# Patient Record
Sex: Male | Born: 2000 | Race: White | Hispanic: No | Marital: Single | Smoking: Never smoker
Health system: Southern US, Community
[De-identification: ages and names within clinical notes are randomized; demographics above are authoritative.]

## PROBLEM LIST (undated history)

## (undated) ENCOUNTER — Emergency Department (HOSPITAL_COMMUNITY): Payer: 59 | Source: Home / Self Care

## (undated) HISTORY — PX: FINGER SURGERY: SHX640

## (undated) HISTORY — PX: BACK SURGERY: SHX140

---
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
## 2019-04-07 DIAGNOSIS — Z681 Body mass index (BMI) 19 or less, adult: Secondary | ICD-10-CM | POA: Diagnosis not present

## 2019-04-07 DIAGNOSIS — Z0000 Encounter for general adult medical examination without abnormal findings: Secondary | ICD-10-CM | POA: Diagnosis not present

## 2019-09-30 ENCOUNTER — Other Ambulatory Visit: Payer: Self-pay

## 2019-09-30 ENCOUNTER — Emergency Department (INDEPENDENT_AMBULATORY_CARE_PROVIDER_SITE_OTHER)
Admission: EM | Admit: 2019-09-30 | Discharge: 2019-09-30 | Disposition: A | Discharge status: Home / Self Care | Payer: 59 | Source: Home / Self Care | Attending: Family Medicine | Admitting: Family Medicine

## 2019-09-30 DIAGNOSIS — H6122 Impacted cerumen, left ear: Secondary | ICD-10-CM

## 2019-09-30 NOTE — ED Provider Notes (Signed)
[facility]    CSN: [id number] Arrival date & time: 09/30/19  1451      History   Chief Complaint Chief Complaint  Patient presents with  . Ear clogged    LT    HPI Dalton Hale is a 18 y.o. male.   Patient reports that his left ear has felt clogged for three days without pain.  He denies nasal congestion or recent URI.  No fevers, chills, and sweats.  He has had a negative COVID19 test.  The history is provided by the patient.    History reviewed. No pertinent past medical history.  Patient Active Problem List   Diagnosis Date Noted  . Contusion of right hip 01/08/2014    History reviewed. No pertinent surgical history.     Home Medications    Prior to Admission medications   Medication Sig Start Date End Date Taking? Authorizing Provider  ibuprofen (ADVIL,MOTRIN) 200 MG tablet Take three tablets ( 600 milligrams total) every 6 with food as needed for pain. 10/08/16   Jacqulyn Cane, MD  terbinafine (LAMISIL) 250 MG tablet Take 1 tablet (250 mg total) by mouth daily. For 14 days 11/30/16   Tyrell Antonio    Family History History reviewed. No pertinent family history.  Social History Social History   Tobacco Use  . Smoking status: Never Smoker  . Smokeless tobacco: Never Used  Substance Use Topics  . Alcohol use: No  . Drug use: No     Allergies   Patient has no known allergies.   Review of Systems Review of Systems No sore throat No cough No pleuritic pain No wheezing No nasal congestion No post-nasal drainage No sinus pain/pressure No itchy/red eyes No earache, but left ear feels clogged No hemoptysis No SOB No fever/chills No nausea No vomiting No abdominal pain No diarrhea No urinary symptoms No skin rash No fatigue No myalgias No headache   Physical Exam Triage Vital Signs ED Triage Vitals  Enc Vitals Group     BP 09/30/19 1535 116/72     Pulse Rate 09/30/19 1535 (!) 107     Resp 09/30/19 1535 18   Temp 09/30/19 1535 98.3 F (36.8 C)     Temp Source 09/30/19 1535 Oral     SpO2 09/30/19 1535 98 %     Weight --      Height --      Head Circumference --      Peak Flow --      Pain Score 09/30/19 1536 0     Pain Loc --      Pain Edu? --      Excl. in [place]? --    No data found.  Updated Vital Signs BP 116/72 (BP Location: Left Arm)   Pulse (!) 107   Temp 98.3 F (36.8 C) (Oral)   Resp 18   SpO2 98%   Visual Acuity Right Eye Distance:   Left Eye Distance:   Bilateral Distance:    Right Eye Near:   Left Eye Near:    Bilateral Near:     Physical Exam Vitals signs and nursing note reviewed.  Constitutional:      General: He is not in acute distress. HENT:     Head: Normocephalic.     Right Ear: Tympanic membrane and ear canal normal.     Left Ear: There is impacted cerumen.     Ears:     Comments: Post left ear lavage, left  canal and tympanic membrane appear normal.    Nose: Nose normal.     Mouth/Throat:     Mouth: Mucous membranes are moist.  Eyes:     Pupils: Pupils are equal, round, and reactive to light.  Cardiovascular:     Rate and Rhythm: Tachycardia present.  Pulmonary:     Effort: Pulmonary effort is normal.  Lymphadenopathy:     Cervical: No cervical adenopathy.  Skin:    General: Skin is warm and dry.  Neurological:     Mental Status: He is alert.      UC Treatments / Results  Labs (all labs ordered are listed, but only abnormal results are displayed) Labs Reviewed - No data to display  EKG   Radiology No results found.  Procedures Procedures (including critical care time)  Medications Ordered in UC Medications - No data to display  Initial Impression / Assessment and Plan / UC Course  I have reviewed the triage vital signs and the nursing notes.  Pertinent labs & imaging results that were available during my care of the patient were reviewed by me and considered in my medical decision making (see chart for details).    Call  for worsening symptoms   Final Clinical Impressions(s) / UC Diagnoses   Final diagnoses:  Impacted cerumen of left ear   Discharge Instructions   None    ED Prescriptions    None        Kandra Nicolas, MD 10/02/19 2223

## 2019-09-30 NOTE — ED Triage Notes (Signed)
Pt c/o LT ear feeling clogged. Has had a cold, but is COVID neg. Denies fever and cough. Taking sudafed prn.

## 2019-11-01 DIAGNOSIS — Z20828 Contact with and (suspected) exposure to other viral communicable diseases: Secondary | ICD-10-CM | POA: Diagnosis not present

## 2019-11-17 MED FILL — CEPHALEXIN 500 MG CAPSULE: 500 | 5 days supply | Qty: 19 | Fill #0

## 2019-11-17 MED FILL — HYDROCODON-APAP 7.5-325: 7.5-325 | 2 days supply | Qty: 10 | Fill #0

## 2019-11-17 MED FILL — IBUPROFEN 600 MG TABLET: 600 | 5 days supply | Qty: 20 | Fill #0

## 2019-12-06 MED FILL — CEPHALEXIN 500 MG CAPSULE: 500 | 5 days supply | Qty: 19 | Fill #0

## 2019-12-06 MED FILL — HYDROCODON-APAP 7.5-325: 7.5-325 | 2 days supply | Qty: 10 | Fill #0

## 2019-12-06 MED FILL — IBUPROFEN 600 MG TABLET: 600 | 5 days supply | Qty: 20 | Fill #0

## 2019-12-08 DIAGNOSIS — K011 Impacted teeth: Secondary | ICD-10-CM | POA: Diagnosis not present

## 2020-02-02 ENCOUNTER — Ambulatory Visit: Payer: 59

## 2020-02-03 ENCOUNTER — Ambulatory Visit: Payer: 59 | Attending: Internal Medicine

## 2020-02-03 DIAGNOSIS — Z23 Encounter for immunization: Secondary | ICD-10-CM

## 2020-02-03 NOTE — Progress Notes (Signed)
   Covid-19 Vaccination Clinic  Name:  Dalton Hale    MRN: [id number] DOB: 05/11/2001  02/03/2020  [name] was observed post Covid-19 immunization for 15 minutes without incident. He was provided with Vaccine Information Sheet and instruction to access the V-Safe system.   [name] was instructed to call 911 with any severe reactions post vaccine: Marland Kitchen Difficulty breathing  . Swelling of face and throat  . A fast heartbeat  . A bad rash all over body  . Dizziness and weakness   Immunizations Administered    Name Date Dose VIS Date Route   Pfizer COVID-19 Vaccine 02/03/2020  8:16 AM 0.3 mL 10/06/2019 Intramuscular   Manufacturer: [place]   Lot: [id number]   [place]: [id number]

## 2020-02-26 ENCOUNTER — Ambulatory Visit: Payer: 59 | Attending: Internal Medicine

## 2020-02-26 DIAGNOSIS — Z23 Encounter for immunization: Secondary | ICD-10-CM

## 2020-02-26 NOTE — Progress Notes (Signed)
   Covid-19 Vaccination Clinic  Name:  Dalton Hale    MRN: [id number] DOB: 09-20-2001  02/26/2020  Mr. Dalton Hale was observed post Covid-19 immunization for 15 minutes without incident. He was provided with Vaccine Information Sheet and instruction to access the V-Safe system.   Dalton Hale was instructed to call 911 with any severe reactions post vaccine: Marland Kitchen Difficulty breathing  . Swelling of face and throat  . A fast heartbeat  . A bad rash all over body  . Dizziness and weakness   Immunizations Administered    Name Date Dose VIS Date Route   Pfizer COVID-19 Vaccine 02/26/2020 11:46 AM 0.3 mL 12/20/2018 Intramuscular   Manufacturer: [place]   Lot: [id number]   [place]: [id number]

## 2020-10-07 DIAGNOSIS — L814 Other melanin hyperpigmentation: Secondary | ICD-10-CM | POA: Diagnosis not present

## 2020-10-07 DIAGNOSIS — D2271 Melanocytic nevi of right lower limb, including hip: Secondary | ICD-10-CM | POA: Diagnosis not present

## 2020-10-07 DIAGNOSIS — D225 Melanocytic nevi of trunk: Secondary | ICD-10-CM | POA: Diagnosis not present

## 2020-10-07 DIAGNOSIS — L74512 Primary focal hyperhidrosis, palms: Secondary | ICD-10-CM | POA: Diagnosis not present

## 2020-10-23 ENCOUNTER — Other Ambulatory Visit: Payer: Self-pay

## 2020-10-23 ENCOUNTER — Other Ambulatory Visit: Payer: 59

## 2020-10-23 DIAGNOSIS — Z20822 Contact with and (suspected) exposure to covid-19: Secondary | ICD-10-CM | POA: Diagnosis not present

## 2020-10-25 LAB — NOVEL CORONAVIRUS, NAA: SARS-CoV-2, NAA: NOT DETECTED

## 2020-10-25 LAB — SARS-COV-2, NAA 2 DAY TAT

## 2022-06-03 ENCOUNTER — Ambulatory Visit (INDEPENDENT_AMBULATORY_CARE_PROVIDER_SITE_OTHER): Payer: 59 | Admitting: Family Medicine

## 2022-06-03 ENCOUNTER — Encounter: Payer: Self-pay | Admitting: Family Medicine

## 2022-06-03 VITALS — BP 122/77 | HR 73 | Temp 98.4°F | Ht 69.75 in | Wt 121.8 lb

## 2022-06-03 DIAGNOSIS — Z0000 Encounter for general adult medical examination without abnormal findings: Secondary | ICD-10-CM | POA: Diagnosis not present

## 2022-06-03 NOTE — Progress Notes (Signed)
Office Note 06/03/2022  CC:  Chief Complaint  Patient presents with   Establish Care   HPI:  Dalton Hale is a 21 y.o. male who is here to establish care and annual health maintenance exam.  Old records were reviewed prior to or during today's visit.  Most recent CPE was 04/07/2019 by [facility] pediatrics in [place], Dr. Trevor Iha.  Cayleb feels well.  No acute concerns.  History reviewed. No pertinent past medical history.  History reviewed. No pertinent surgical history.  History reviewed. No pertinent family history.  Social History   Socioeconomic History   Marital status: Single    Spouse name: Not on file   Number of children: Not on file   Years of education: Not on file   Highest education level: Not on file  Occupational History   Not on file  Tobacco Use   Smoking status: Never   Smokeless tobacco: Never  Vaping Use   Vaping Use: Never used  Substance and Sexual Activity   Alcohol use: No   Drug use: No   Sexual activity: Not on file  Other Topics Concern   Not on file  Social History Narrative   High school grad, some college.   Works as a Animator, Optometrist.   No tobacco.  Occasional alcohol.   Social Determinants of Health   Financial Resource Strain: Not on file  Food Insecurity: Not on file  Transportation Needs: Not on file  Physical Activity: Not on file  Stress: Not on file  Social Connections: Not on file  Intimate Partner Violence: Not on file    Outpatient Encounter Medications as of 06/03/2022  Medication Sig   IBUPROFEN PO Take by mouth as needed.   [DISCONTINUED] ibuprofen (ADVIL,MOTRIN) 200 MG tablet Take three tablets ( 600 milligrams total) every 6 with food as needed for pain.   [DISCONTINUED] terbinafine (LAMISIL) 250 MG tablet Take 1 tablet (250 mg total) by mouth daily. For 14 days   No facility-administered encounter medications on file as of 06/03/2022.    No Known  Allergies  Review of Systems  Constitutional:  Negative for appetite change, chills, fatigue and fever.  HENT:  Negative for congestion, dental problem, ear pain and sore throat.   Eyes:  Negative for discharge, redness and visual disturbance.  Respiratory:  Negative for cough, chest tightness, shortness of breath and wheezing.   Cardiovascular:  Negative for chest pain, palpitations and leg swelling.  Gastrointestinal:  Negative for abdominal pain, blood in stool, diarrhea, nausea and vomiting.  Genitourinary:  Negative for difficulty urinating, dysuria, flank pain, frequency, hematuria and urgency.  Musculoskeletal:  Negative for arthralgias, back pain, joint swelling, myalgias and neck stiffness.  Skin:  Negative for pallor and rash.  Neurological:  Negative for dizziness, speech difficulty, weakness and headaches.  Hematological:  Negative for adenopathy. Does not bruise/bleed easily.  Psychiatric/Behavioral:  Negative for confusion and sleep disturbance. The patient is not nervous/anxious.     PE; Blood pressure 122/77, pulse 73, temperature 98.4 F (36.9 C), temperature source Oral, height 5' 9.75" (1.772 m), weight 121 lb 12.8 oz (55.2 kg), SpO2 98 %. Body mass index is 17.6 kg/m.  Physical Exam  Gen: Alert, well appearing.  Patient is oriented to person, place, time, and situation. AFFECT: pleasant, lucid thought and speech. ENT: Ears: EACs clear, normal epithelium.  TMs with good light reflex and landmarks bilaterally.  Eyes: no injection, icteris, swelling, or exudate.  EOMI, PERRLA. Nose: no  drainage or turbinate edema/swelling.  No injection or focal lesion.  Mouth: lips without lesion/swelling.  Oral mucosa pink and moist.  Dentition intact and without obvious caries or gingival swelling.  Oropharynx without erythema, exudate, or swelling.  Neck: supple/nontender.  No LAD, mass, or TM.  Carotid pulses 2+ bilaterally, without bruits. CV: RRR, no m/r/g.   LUNGS: CTA bilat,  nonlabored resps, good aeration in all lung fields. ABD: soft, NT, ND, BS normal.  No hepatospenomegaly or mass.  No bruits. EXT: no clubbing, cyanosis, or edema.  Musculoskeletal: no joint swelling, erythema, warmth, or tenderness.  ROM of all joints intact. Skin - no sores or suspicious lesions or rashes or color changes  Pertinent labs:  none  ASSESSMENT AND PLAN:   New patient, establishing care.  Health maintenance exam: Reviewed age and gender appropriate health maintenance issues (prudent diet, regular exercise, health risks of tobacco and excessive alcohol, use of seatbelts, fire alarms in home, use of sunscreen).  Also reviewed age and gender appropriate health screening as well as vaccine recommendations. Vaccines: Tdap UTD 2018 Labs:none indicated  An After Visit Summary was printed and given to the patient.  Signed:  Crissie Sickles, MD           06/03/2022

## 2022-08-03 ENCOUNTER — Encounter: Payer: Self-pay | Admitting: Emergency Medicine

## 2022-08-03 ENCOUNTER — Ambulatory Visit
Admission: EM | Admit: 2022-08-03 | Discharge: 2022-08-03 | Disposition: A | Discharge status: Home / Self Care | Payer: 59 | Attending: Urgent Care | Admitting: Urgent Care

## 2022-08-03 DIAGNOSIS — H6692 Otitis media, unspecified, left ear: Secondary | ICD-10-CM | POA: Diagnosis not present

## 2022-08-03 DIAGNOSIS — H6122 Impacted cerumen, left ear: Secondary | ICD-10-CM

## 2022-08-03 DIAGNOSIS — H7292 Unspecified perforation of tympanic membrane, left ear: Secondary | ICD-10-CM | POA: Diagnosis not present

## 2022-08-03 MED ORDER — AMOXICILLIN 500 MG PO CAPS: 1000.0000 mg | ORAL_CAPSULE | Freq: Two times a day (BID) | ORAL | 0 refills | Status: AC

## 2022-08-03 NOTE — ED Provider Notes (Signed)
[facility]    CSN: [id number] Arrival date & time: 08/03/22  1431      History   Chief Complaint Chief Complaint  Patient presents with   Ear Fullness    HPI Dalton Hale is a 21 y.o. male.   Pleasant 21 year old male presents today due to left ear pain starting several days ago.  Went to [employer] this past weekend, was swimming in the ocean.  States that the pain started shortly afterwards.  States is uncomfortable to movement of the tragus, but otherwise does not hurt to lay on his ear.  No drainage.  States that his hearing is slightly diminished, and leaning forward causes a pulsatile pressure behind his eardrum.  He denies any additional URI symptoms.  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 Has tried no OTC meds.    Ear Fullness    History reviewed. No pertinent past medical history.  Patient Active Problem List   Diagnosis Date Noted   Contusion of right hip 01/08/2014    History reviewed. No pertinent surgical history.     Home Medications    Prior to Admission medications   Medication Sig Start Date End Date Taking? Authorizing Provider  amoxicillin (AMOXIL) 500 MG capsule Take 2 capsules (1,000 mg total) by mouth 2 (two) times daily for 10 days. 08/03/22 08/13/22 Yes Kimi Bordeau L, PA  IBUPROFEN PO Take by mouth as needed.    [provider]    Family History History reviewed. No pertinent family history.  Social History Social History   Tobacco Use   Smoking status: Never   Smokeless tobacco: Never  Vaping Use   Vaping Use: Never used  Substance Use Topics   Alcohol use: No   Drug use: No     Allergies   Patient has no known allergies.   Review of Systems Review of Systems  Eyes:  Positive for pain.  All other systems reviewed and are negative.    Physical Exam Triage Vital Signs ED Triage Vitals  Enc Vitals Group     BP 08/03/22 1503 135/74     Pulse Rate  08/03/22 1503 96     Resp 08/03/22 1503 14     Temp 08/03/22 1503 98.7 F (37.1 C)     Temp Source 08/03/22 1503 Oral     SpO2 08/03/22 1503 98 %     Weight --      Height --      Head Circumference --      Peak Flow --      Pain Score 08/03/22 1504 2     Pain Loc --      Pain Edu? --      Excl. in [place]? --    No data found.  Updated Vital Signs BP 135/74 (BP Location: Right Arm)   Pulse 96   Temp 98.7 F (37.1 C) (Oral)   Resp 14   SpO2 98%   Visual Acuity Right Eye Distance:   Left Eye Distance:   Bilateral Distance:    Right Eye Near:   Left Eye Near:    Bilateral Near:     Physical Exam Vitals and nursing note reviewed.  Constitutional:      General: He is not in acute distress.    Appearance: Normal appearance. He is well-developed. He is not ill-appearing, toxic-appearing or diaphoretic.  HENT:     Head: Normocephalic and atraumatic.  Jaw: There is normal jaw occlusion. No trismus.     Salivary Glands: Right salivary gland is not diffusely enlarged or tender. Left salivary gland is not diffusely enlarged or tender.     Right Ear: Tympanic membrane, ear canal and external ear normal. No middle ear effusion. There is no impacted cerumen. Tympanic membrane is not injected, scarred, perforated, erythematous, retracted or bulging. Tympanic membrane has normal mobility.     Left Ear: A middle ear effusion is present. There is impacted cerumen. Tympanic membrane is injected, perforated, erythematous and retracted. Tympanic membrane has decreased mobility.     Nose: Nose normal. No congestion or rhinorrhea.     Mouth/Throat:     Mouth: Mucous membranes are moist.     Pharynx: Oropharynx is clear. No oropharyngeal exudate or posterior oropharyngeal erythema.  Eyes:     General: No scleral icterus.       Right eye: No discharge.        Left eye: No discharge.     Extraocular Movements: Extraocular movements intact.     Conjunctiva/sclera: Conjunctivae normal.      Pupils: Pupils are equal, round, and reactive to light.  Cardiovascular:     Rate and Rhythm: Normal rate.  Pulmonary:     Effort: Pulmonary effort is normal. No respiratory distress.  Musculoskeletal:     Cervical back: Normal range of motion and neck supple. No rigidity or tenderness.  Lymphadenopathy:     Cervical: No cervical adenopathy.  Skin:    General: Skin is warm and dry.     Coloration: Skin is not jaundiced.     Findings: No erythema or rash.  Neurological:     General: No focal deficit present.     Mental Status: He is alert and oriented to person, place, and time.  Psychiatric:        Mood and Affect: Mood normal.      UC Treatments / Results  Labs (all labs ordered are listed, but only abnormal results are displayed) Labs Reviewed - No data to display  EKG   Radiology No results found.  Procedures Ear Cerumen Removal  Date/Time: 08/03/2022 3:56 PM  Performed by: Chaney Malling, PA Authorized by: Chaney Malling, PA   Consent:    Consent obtained:  Verbal   Consent given by:  Patient   Risks, benefits, and alternatives were discussed: yes     Risks discussed:  Bleeding, dizziness, infection, incomplete removal, TM perforation and pain   Alternatives discussed:  No treatment Universal protocol:    Procedure explained and questions answered to patient or proxy's satisfaction: yes     Patient identity confirmed:  Verbally with patient Procedure details:    Location:  L ear   Procedure type: curette     Procedure outcomes: cerumen removed   Post-procedure details:    Inspection:  No bleeding and ear canal clear   Hearing quality:  Improved   Procedure completion:  Tolerated Comments:     TM perf noted prior to removal of cerumen which was not completely obstructing view of TM  (including critical care time)  Medications Ordered in UC Medications - No data to display  Initial Impression / Assessment and Plan / UC Course  I have reviewed the  triage vital signs and the nursing notes.  Pertinent labs & imaging results that were available during my care of the patient were reviewed by me and considered in my medical decision making (see chart for details).  Acute OM L ear with perf -we will start antibiotics twice daily for the next 10 days.  Recommended patient follow-up with his PCP in the next 2 weeks for further evaluation of his eardrum.  Small perforation noted centrally. Impacted cerumen of left ear -removed in clinic today.  Small amount of stool softener had to be placed in the ear to soften wax, removed with curette without incident.   Final Clinical Impressions(s) / UC Diagnoses   Final diagnoses:  Acute otitis media of left ear with perforation  Impacted cerumen of left ear     Discharge Instructions      You have a left ear infection.  It appears as though the infection has also caused a microperforation, which is a small hole in your eardrum. Please take the antibiotics prescribed today.  Do not stop them just because you are feeling better, take all of them until gone. Please schedule follow-up with your primary care physician to have them take a look at your ear in the next 3 to 4 weeks. We need to ensure that the perforation heals itself. Do not stick anything in your ear or submerge your head in water until you are rechecked. We removed ear wax from your left ear today.     ED Prescriptions     Medication Sig Dispense Auth. Provider   amoxicillin (AMOXIL) 500 MG capsule Take 2 capsules (1,000 mg total) by mouth 2 (two) times daily for 10 days. 40 capsule Shona Pardo L, PA      PDMP not reviewed this encounter.   Chaney Malling, [place] 08/03/22 2001

## 2022-08-03 NOTE — ED Triage Notes (Signed)
Pt c/o left ear fullness and mild pain sine yesterday. States he went to the beach this past weekend and was swimming. Has not triied anything for this,

## 2022-08-03 NOTE — Discharge Instructions (Addendum)
You have a left ear infection.  It appears as though the infection has also caused a microperforation, which is a small hole in your eardrum. Please take the antibiotics prescribed today.  Do not stop them just because you are feeling better, take all of them until gone. Please schedule follow-up with your primary care physician to have them take a look at your ear in the next 3 to 4 weeks. We need to ensure that the perforation heals itself. Do not stick anything in your ear or submerge your head in water until you are rechecked. We removed ear wax from your left ear today.

## 2022-08-26 ENCOUNTER — Encounter: Payer: Self-pay | Admitting: Family Medicine

## 2022-08-26 ENCOUNTER — Ambulatory Visit: Payer: 59 | Admitting: Family Medicine

## 2022-08-26 VITALS — BP 101/72 | HR 95 | Temp 98.2°F | Resp 12 | Ht 69.75 in | Wt 126.4 lb

## 2022-08-26 DIAGNOSIS — H66012 Acute suppurative otitis media with spontaneous rupture of ear drum, left ear: Secondary | ICD-10-CM

## 2022-08-26 NOTE — Progress Notes (Signed)
OFFICE VISIT  08/26/2022  CC:  Chief Complaint  Patient presents with   follow up hole left eardrum    Patient is a 21 y.o. male who presents for follow-up emergency department/urgent care visit for left ear pain  HPI: Patient presented to [facility] urgent care on 08/03/2022 for left ear pain that started after swimming in the ocean. He was found to have left otitis media with small perforation as well as cerumen impaction.  He was prescribed amoxicillin 1000 mg twice daily for 10 days.  UPDATE: He feels well.  No ear pain, no hearing impairment, no ear pressure.  No ear drainage.  He took all of his antibiotics.  History reviewed. No pertinent past medical history.  History reviewed. No pertinent surgical history.  Outpatient Medications Prior to Visit  Medication Sig Dispense Refill   IBUPROFEN PO Take by mouth as needed.     No facility-administered medications prior to visit.    No Known Allergies  ROS As per HPI  PE:    08/26/2022    2:48 PM 08/03/2022    3:03 PM 06/03/2022    2:49 PM  Vitals with BMI  Height 5' 9.75"  5' 9.75"  Weight 126 lbs 6 oz  121 lbs 13 oz  BMI 25.42  70.62  Systolic 376 283 151  Diastolic 72 74 77  Pulse 95 96 73     Physical Exam  Gen: Alert, well appearing.  Patient is oriented to person, place, time, and situation. Ears: External auditory canals and tympanic membranes are without erythema or swelling.  No injection of the tympanic membrane and no perforation. No cerumen.  LABS:  none  IMPRESSION AND PLAN:  Left ear otitis media with perforation. Completely healed status post amoxicillin. Reassured.  An After Visit Summary was printed and given to the patient.  FOLLOW UP: Return for as needed.  Signed:  Crissie Sickles, MD           08/26/2022

## 2023-05-24 ENCOUNTER — Telehealth: Payer: 59 | Admitting: Physician Assistant

## 2023-05-24 DIAGNOSIS — S39012A Strain of muscle, fascia and tendon of lower back, initial encounter: Secondary | ICD-10-CM | POA: Diagnosis not present

## 2023-05-24 DIAGNOSIS — M5431 Sciatica, right side: Secondary | ICD-10-CM | POA: Diagnosis not present

## 2023-05-24 MED ORDER — CYCLOBENZAPRINE HCL 10 MG PO TABS: 5.0000 mg | ORAL_TABLET | Freq: Three times a day (TID) | ORAL | 0 refills | Status: AC | PRN

## 2023-05-24 MED ORDER — METHYLPREDNISOLONE 4 MG PO TBPK: ORAL_TABLET | ORAL | 0 refills | Status: AC

## 2023-05-24 NOTE — Progress Notes (Signed)
We are sorry that you are not feeling well.  Here is how we plan to help!  Based on what you have shared with me it looks like you mostly have acute back pain.  Acute back pain is defined as musculoskeletal pain that can resolve in 1-3 weeks with conservative treatment.  I have prescribed Medrol dose pack, a steroid anti-inflammatory, as well as Flexeril 10 mg every eight hours as needed which is a muscle relaxer  Some patients experience stomach irritation or in increased heartburn with anti-inflammatory drugs.  Please keep in mind that muscle relaxer's can cause fatigue and should not be taken while at work or driving.  Back pain is very common.  The pain often gets better over time.  The cause of back pain is usually not dangerous.  Most people can learn to manage their back pain on their own.  Home Care Stay active.  Start with short walks on flat ground if you can.  Try to walk farther each day. Do not sit, drive or stand in one place for more than 30 minutes.  Do not stay in bed. Do not avoid exercise or work.  Activity can help your back heal faster. Be careful when you bend or lift an object.  Bend at your knees, keep the object close to you, and do not twist. Sleep on a firm mattress.  Lie on your side, and bend your knees.  If you lie on your back, put a pillow under your knees. Only take medicines as told by your doctor. Put ice on the injured area. Put ice in a plastic bag Place a towel between your skin and the bag Leave the ice on for 15-20 minutes, 3-4 times a day for the first 2-3 days. 210 After that, you can switch between ice and heat packs. Ask your doctor about back exercises or massage. Avoid feeling anxious or stressed.  Find good ways to deal with stress, such as exercise.  Get Help Right Way If: Your pain does not go away with rest or medicine. Your pain does not go away in 1 week. You have new problems. You do not feel well. The pain spreads into your legs. You  cannot control when you poop (bowel movement) or pee (urinate) You feel sick to your stomach (nauseous) or throw up (vomit) You have belly (abdominal) pain. You feel like you may pass out (faint). If you develop a fever.  Make Sure you: Understand these instructions. Will watch your condition Will get help right away if you are not doing well or get worse.  Your e-visit answers were reviewed by a board certified advanced clinical practitioner to complete your personal care plan.  Depending on the condition, your plan could have included both over the counter or prescription medications.  If there is a problem please reply  once you have received a response from your provider.  Your safety is important to [place].  If you have drug allergies check your prescription carefully.    You can use MyChart to ask questions about today's visit, request a non-urgent call back, or ask for a work or school excuse for 24 hours related to this e-Visit. If it has been greater than 24 hours you will need to follow up with your provider, or enter a new e-Visit to address those concerns.  You will get an e-mail in the next two days asking about your experience.  I hope that your e-visit has been valuable and will  speed your recovery. Thank you for using e-visits.  I have spent 5 minutes in review of e-visit questionnaire, review and updating patient chart, medical decision making and response to patient.   Margaretann Loveless, PA-C

## 2023-06-04 ENCOUNTER — Ambulatory Visit: Payer: 59 | Admitting: Family Medicine

## 2023-06-04 ENCOUNTER — Encounter: Payer: Self-pay | Admitting: Family Medicine

## 2023-06-04 VITALS — BP 116/70 | HR 126 | Wt 117.0 lb

## 2023-06-04 DIAGNOSIS — M545 Low back pain, unspecified: Secondary | ICD-10-CM | POA: Diagnosis not present

## 2023-06-04 DIAGNOSIS — S8392XA Sprain of unspecified site of left knee, initial encounter: Secondary | ICD-10-CM | POA: Diagnosis not present

## 2023-06-04 NOTE — Progress Notes (Signed)
OFFICE VISIT  06/04/2023  CC:  Chief Complaint  Patient presents with   Back Pain    6 months ago, every other month; started to hurt more once knee started hurting. Has to lift heavy for his job, so since he could no longer lift with legs/knee it has affected his back. Had a virtual visit through mychart and was prescribed prednisone and since back has stopped hurting.    Knee Pain    Was knee-boarding and fell on knee, 2 months ago. Does not hurt as of today, but has some pain when bending down and lifting.     Patient is a 22 y.o. male who presents for knee pain and back pain.  HPI: About 2 months ago Dalton Hale was knee boarding and hit a wave and hyperflexed his left knee.  It was a little swollen and he limped for a couple days and then felt a lot better.  Since then it has continued to hurt with deep knee bends but otherwise has been without problem.  He wanted to get it checked out.  He got a hinged knee brace yesterday. 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  Periodically gets some low back pain that usually lasts a couple days.  He gets more twinges of this since his knee injury because of altered gait at times.  He describes a spasm in the right low back periodically.  No radiation down the buttocks or leg.  No paresthesias.  History reviewed. No pertinent past medical history.  History reviewed. No pertinent surgical history.  Outpatient Medications Prior to Visit  Medication Sig Dispense Refill   IBUPROFEN PO Take by mouth as needed.     cyclobenzaprine (FLEXERIL) 10 MG tablet Take 0.5-1 tablets (5-10 mg total) by mouth 3 (three) times daily as needed. (Patient not taking: Reported on 06/04/2023) 30 tablet 0   methylPREDNISolone (MEDROL DOSEPAK) 4 MG TBPK tablet 6 day taper; take as directed on package instructions (Patient not taking: Reported on 06/04/2023) 21 tablet 0   No facility-administered medications prior to visit.    No Known Allergies  Review of Systems   As per HPI  PE:    06/04/2023    2:50 PM 08/26/2022    2:48 PM 08/03/2022    3:03 PM  Vitals with BMI  Height  5' 9.75"   Weight 117 lbs 126 lbs 6 oz   BMI  18.26   Systolic 116 101 956  Diastolic 70 72 74  Pulse 126 95 96     Physical Exam  Gen: Alert, well appearing.  Patient is oriented to person, place, time, and situation. AFFECT: pleasant, lucid thought and speech. Back: No deformity.  No tenderness.  No bruising. Range of motion fully intact.  Sitting straight leg raise negative bilaterally. Left knee without swelling, warmth, erythema, or tenderness.  No popliteal mass palpable.  No instability.  Range of motion fully intact.  LABS:  none  IMPRESSION AND PLAN:  #1 left knee sprain. Gradually resolving. Continue hinged knee brace.  #2 episodic musculoskeletal low back pain. Mild flareup due to altered gait from #1 above.  Emphasized the need for him to avoid heavy lifting, at least for the next few weeks.  An After Visit Summary was printed and given to the patient.  FOLLOW UP: No follow-ups on file.  Signed:  Santiago Bumpers, MD           06/04/2023

## 2023-08-26 ENCOUNTER — Telehealth: Payer: 59 | Admitting: Emergency Medicine

## 2023-08-26 DIAGNOSIS — J029 Acute pharyngitis, unspecified: Secondary | ICD-10-CM

## 2023-08-26 NOTE — Progress Notes (Signed)
  E-Visit for Sore Throat  We are sorry that you are not feeling well.  Here is how we plan to help!  Your symptoms indicate a likely viral infection (Pharyngitis).   Pharyngitis is inflammation in the back of the throat which can cause a sore throat, scratchiness and sometimes difficulty swallowing.   Pharyngitis is typically caused by a respiratory virus and will just run its course.  Please keep in mind that your symptoms could last up to 10 days.  For throat pain, we recommend over the counter oral pain relief medications such as acetaminophen or aspirin, or anti-inflammatory medications such as ibuprofen or naproxen sodium.  Topical treatments such as oral throat lozenges or sprays may be used as needed.  Avoid close contact with loved ones, especially the very young and elderly.  Remember to wash your hands thoroughly throughout the day as this is the number one way to prevent the spread of infection and wipe down door knobs and counters with disinfectant.  After careful review of your answers, I would not recommend an antibiotic for your condition.  Antibiotics should not be used to treat conditions that we suspect are caused by viruses like the virus that causes the common cold or flu. However, some people can have Strep with atypical symptoms. You may need formal testing in clinic or office to confirm if your symptoms continue or worsen.  Providers prescribe antibiotics to treat infections caused by bacteria. Antibiotics are very powerful in treating bacterial infections when they are used properly.  To maintain their effectiveness, they should be used only when necessary.  Overuse of antibiotics has resulted in the development of super bugs that are resistant to treatment!    Home Care: Only take medications as instructed by your medical team. Do not drink alcohol while taking these medications. A steam or ultrasonic humidifier can help congestion.  You can place a towel over your head and  breathe in the steam from hot water coming from a faucet. Avoid close contacts especially the very young and the elderly. Cover your mouth when you cough or sneeze. Always remember to wash your hands.  Get Help Right Away If: You develop worsening fever or throat pain. You develop a severe head ache or visual changes. Your symptoms persist after you have completed your treatment plan.  Make sure you Understand these instructions. Will watch your condition. Will get help right away if you are not doing well or get worse.   Thank you for choosing an e-visit.  Your e-visit answers were reviewed by a board certified advanced clinical practitioner to complete your personal care plan. Depending upon the condition, your plan could have included both over the counter or prescription medications.  Please review your pharmacy choice. Make sure the pharmacy is open so you can pick up prescription now. If there is a problem, you may contact your provider through MyChart messaging and have the prescription routed to another pharmacy.  Your safety is important to us. If you have drug allergies check your prescription carefully.   For the next 24 hours you can use MyChart to ask questions about today's visit, request a non-urgent call back, or ask for a work or school excuse. You will get an email in the next two days asking about your experience. I hope that your e-visit has been valuable and will speed your recovery.  Approximately 5 minutes was used in reviewing the patient's chart, questionnaire, prescribing medications, and documentation.  

## 2024-01-21 ENCOUNTER — Other Ambulatory Visit: Payer: Self-pay

## 2024-01-21 ENCOUNTER — Ambulatory Visit: Admission: EM | Admit: 2024-01-21 | Discharge: 2024-01-21 | Disposition: A | Discharge status: Home / Self Care

## 2024-01-21 DIAGNOSIS — N50812 Left testicular pain: Secondary | ICD-10-CM

## 2024-01-21 NOTE — ED Provider Notes (Signed)
 [facility]    CSN: [id number] Arrival date & time: 01/21/24  1510      History   Chief Complaint Chief Complaint  Patient presents with   Testicle Pain    HPI Dalton Hale is a 23 y.o. male.   HPI  Discussed the use of AI scribe software for clinical note transcription with the patient, who gave verbal consent to proceed.  The patient presents with left testicular discomfort following physical activity. He experiences discomfort in his left testicle, which began after climbing activities earlier in the week. The discomfort started mildly after climbing on Monday or Tuesday but significantly increased after climbing on a large baler at work the following day. The discomfort is localized to the testicle without radiation to the abdomen or side. No swelling, discoloration, or significant tenderness upon palpation, although he mentions the left testicle feels 'weird' and may be sitting differently. The discomfort is more noticeable when he puts weight on his left foot, particularly when getting out of bed. No urinary symptoms, nausea, vomiting, or changes in bowel habits. 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 For pain management, he has been taking ibuprofen.    History reviewed. No pertinent past medical history.  Patient Active Problem List   Diagnosis Date Noted   Contusion of right hip 01/08/2014    Past Surgical History:  Procedure Laterality Date   FINGER SURGERY         Home Medications    Prior to Admission medications   Medication Sig Start Date End Date Taking? Authorizing Provider  cyclobenzaprine (FLEXERIL) 10 MG tablet Take 0.5-1 tablets (5-10 mg total) by mouth 3 (three) times daily as needed. Patient not taking: Reported on 06/04/2023 05/24/23   Margaretann Loveless, PA-C  IBUPROFEN PO Take by mouth as needed.    [provider]   methylPREDNISolone (MEDROL DOSEPAK) 4 MG TBPK tablet 6 day taper; take as directed on package instructions Patient not taking: Reported on 06/04/2023 05/24/23   Margaretann Loveless, PA-C    Family History History reviewed. No pertinent family history.  Social History Social History   Tobacco Use   Smoking status: Never   Smokeless tobacco: Never  Vaping Use   Vaping status: Never Used  Substance Use Topics   Alcohol use: No   Drug use: No     Allergies   Patient has no known allergies.   Review of Systems Review of Systems  Gastrointestinal:  Negative for abdominal pain, constipation, diarrhea, nausea and vomiting.  Genitourinary:  Positive for testicular pain. Negative for dysuria, flank pain, frequency, genital sores, penile discharge, penile pain, penile swelling and scrotal swelling.     Physical Exam Triage Vital Signs ED Triage Vitals  Encounter Vitals Group     BP 01/21/24 1518 128/73     Systolic BP Percentile --      Diastolic BP Percentile --      Pulse Rate 01/21/24 1518 89     Resp 01/21/24 1518 16     Temp 01/21/24 1518 99.4 F (37.4 C)     Temp src --      SpO2 01/21/24 1518 98 %     Weight --      Height --      Head Circumference --      Peak Flow --      Pain Score 01/21/24 1521 2  Pain Loc --      Pain Education --      Exclude from Growth Chart --    No data found.  Updated Vital Signs BP 128/73   Pulse 89   Temp 99.4 F (37.4 C)   Resp 16   SpO2 98%   Visual Acuity Right Eye Distance:   Left Eye Distance:   Bilateral Distance:    Right Eye Near:   Left Eye Near:    Bilateral Near:     Physical Exam Vitals reviewed. Exam conducted with a chaperone present.  Constitutional:      General: He is awake. He is not in acute distress.    Appearance: Normal appearance. He is well-developed and well-groomed. He is not ill-appearing, toxic-appearing or diaphoretic.     Comments: Patient is a pleasant, well-appearing 23 year old  male who appears stated age.  He is resting comfortably in exam room in no apparent distress and does not appear toxic or diaphoretic  HENT:     Head: Normocephalic and atraumatic.  Eyes:     Extraocular Movements: Extraocular movements intact.     Conjunctiva/sclera: Conjunctivae normal.  Pulmonary:     Effort: Pulmonary effort is normal.  Abdominal:     General: Abdomen is flat. Bowel sounds are normal.     Palpations: Abdomen is soft.     Tenderness: There is no abdominal tenderness.     Hernia: There is no hernia in the umbilical area, ventral area, left inguinal area or right inguinal area.     Comments: No obvious signs of inguinal hernia on the right or the left side well and reclined position or while standing.  Genitourinary:    Pubic Area: No rash or pubic lice.      Penis: Normal and circumcised.      Testes: Cremasteric reflex is present.        Right: Mass, tenderness, swelling, testicular hydrocele or varicocele not present. Right testis is descended. Cremasteric reflex is present.         Left: Tenderness present. Mass, swelling, testicular hydrocele or varicocele not present. Left testis is descended. Cremasteric reflex is present.      Comments: Chaperoned by Phillips Grout, RN  Patient reports mild tenderness along the posterior aspect of the left testicle Skin:    General: Skin is warm and dry.  Neurological:     General: No focal deficit present.     Mental Status: He is alert and oriented to person, place, and time.  Psychiatric:        Mood and Affect: Mood normal.        Behavior: Behavior normal. Behavior is cooperative.        Thought Content: Thought content normal.      UC Treatments / Results  Labs (all labs ordered are listed, but only abnormal results are displayed) Labs Reviewed - No data to display  EKG   Radiology No results found.  Procedures Procedures (including critical care time)  Medications Ordered in UC Medications - No  data to display  Initial Impression / Assessment and Plan / UC Course  I have reviewed the triage vital signs and the nursing notes.  Pertinent labs & imaging results that were available during my care of the patient were reviewed by me and considered in my medical decision making (see chart for details).      Final Clinical Impressions(s) / UC Diagnoses   Final diagnoses:  Pain in left testicle  Left Testicular Discomfort  Discomfort in the left testicle post-climbing activities, localized without radiation to the abdomen or groin. No swelling, discoloration, or significant tenderness. Denies urinary symptoms, nausea, vomiting, or bowel habit changes. Physical exam shows no hernia or testicular torsion. Differential includes minor hernia or groin muscle strain. Testicular torsion unlikely due to normal cremasteric reflex and absence of severe pain or swelling. Conservative management with analgesics and monitoring advised. Consider ultrasound if symptoms persist to rule out hernia.  - Alternate acetaminophen and ibuprofen for analgesia.  - Monitor symptoms; seek further evaluation if no improvement by Wednesday.  - Seek emergency care for increased pain, swelling, or discoloration.  - Consider ultrasound if symptoms persist to rule out hernia.     Discharge Instructions      VISIT SUMMARY:  You came in today because of discomfort in your left testicle that started after some physical activities, including climbing. You mentioned that the discomfort increased after climbing on a large baler at work. There is no swelling, discoloration, or significant tenderness, and you do not have any urinary symptoms, nausea, vomiting, or changes in bowel habits.  YOUR PLAN:  -LEFT TESTICULAR DISCOMFORT: Your discomfort in the left testicle is likely due to minor strain or a possible minor hernia from your recent physical activities. There is no indication of testicular torsion or hernia based on  the physical exam. You should alternate between acetaminophen and ibuprofen for pain relief. Monitor your symptoms, and if there is no improvement by Wednesday, seek further evaluation and potential management with your PCP. If you experience increased pain, swelling, or discoloration, seek emergency care. If symptoms persist, an ultrasound may be needed to rule out a hernia.  INSTRUCTIONS:  Monitor your symptoms and seek further evaluation if there is no improvement by Wednesday. Seek emergency care if you experience increased pain, swelling, or discoloration. If symptoms persist, consider getting an ultrasound to rule out a hernia.     ED Prescriptions   None    PDMP not reviewed this encounter.   Providence Crosby, PA-C 01/21/24 1624

## 2024-01-21 NOTE — Discharge Instructions (Signed)
 VISIT SUMMARY:  You came in today because of discomfort in your left testicle that started after some physical activities, including climbing. You mentioned that the discomfort increased after climbing on a large baler at work. There is no swelling, discoloration, or significant tenderness, and you do not have any urinary symptoms, nausea, vomiting, or changes in bowel habits.  YOUR PLAN:  -LEFT TESTICULAR DISCOMFORT: Your discomfort in the left testicle is likely due to minor strain or a possible minor hernia from your recent physical activities. There is no indication of testicular torsion or hernia based on the physical exam. You should alternate between acetaminophen and ibuprofen for pain relief. Monitor your symptoms, and if there is no improvement by Wednesday, seek further evaluation and potential management with your PCP. If you experience increased pain, swelling, or discoloration, seek emergency care. If symptoms persist, an ultrasound may be needed to rule out a hernia.  INSTRUCTIONS:  Monitor your symptoms and seek further evaluation if there is no improvement by Wednesday. Seek emergency care if you experience increased pain, swelling, or discoloration. If symptoms persist, consider getting an ultrasound to rule out a hernia.

## 2024-01-21 NOTE — ED Triage Notes (Signed)
 C/o left testicular pain which started Tuesday but Feb 11, 2024 pain became more pronounced after "getting onto this thing at work". Has been taking ibuprofen.

## 2024-05-06 ENCOUNTER — Ambulatory Visit
Admission: EM | Admit: 2024-05-06 | Discharge: 2024-05-06 | Disposition: A | Discharge status: Home / Self Care | Attending: Family Medicine | Admitting: Family Medicine

## 2024-05-06 ENCOUNTER — Other Ambulatory Visit: Payer: Self-pay

## 2024-05-06 DIAGNOSIS — S0101XA Laceration without foreign body of scalp, initial encounter: Secondary | ICD-10-CM | POA: Diagnosis not present

## 2024-05-06 DIAGNOSIS — S4991XA Unspecified injury of right shoulder and upper arm, initial encounter: Secondary | ICD-10-CM

## 2024-05-06 DIAGNOSIS — S0990XA Unspecified injury of head, initial encounter: Secondary | ICD-10-CM

## 2024-05-06 MED ORDER — AMOXICILLIN-POT CLAVULANATE 875-125 MG PO TABS: 1.0000 | ORAL_TABLET | Freq: Two times a day (BID) | ORAL | 0 refills | Status: AC
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
## 2024-05-06 NOTE — ED Notes (Signed)
 Cleaned wounds with hipacleanse and warm water  Head abrasion, right back, right sholdure, right elbow and right hip all cleaned with wound cleaner

## 2024-05-06 NOTE — Discharge Instructions (Addendum)
 Advised patient to take medication as directed with food to completion.  Advised may take OTC ibuprofen  daily as needed for contusion pain.  Encouraged to increase daily water intake to 64 ounces per day while taking these medications.  Advised if symptoms worsen and/or unresolved please follow-up with your PCP or here for further evaluation.
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
## 2024-07-27 DIAGNOSIS — Z23 Encounter for immunization: Secondary | ICD-10-CM | POA: Diagnosis not present

## 2024-07-27 DIAGNOSIS — N6320 Unspecified lump in the left breast, unspecified quadrant: Secondary | ICD-10-CM | POA: Diagnosis not present
